# Patient Record
Sex: Female | Born: 1985 | Race: White | Hispanic: No | Marital: Single | Smoking: Current every day smoker
Health system: Southern US, Community
[De-identification: ages and names within clinical notes are randomized; demographics above are authoritative.]

## PROBLEM LIST (undated history)

## (undated) DIAGNOSIS — F99 Mental disorder, not otherwise specified: Secondary | ICD-10-CM

## (undated) DIAGNOSIS — K589 Irritable bowel syndrome without diarrhea: Secondary | ICD-10-CM

## (undated) DIAGNOSIS — R87619 Unspecified abnormal cytological findings in specimens from cervix uteri: Secondary | ICD-10-CM

## (undated) DIAGNOSIS — F17200 Nicotine dependence, unspecified, uncomplicated: Secondary | ICD-10-CM

## (undated) DIAGNOSIS — IMO0002 Reserved for concepts with insufficient information to code with codable children: Secondary | ICD-10-CM

## (undated) DIAGNOSIS — F988 Other specified behavioral and emotional disorders with onset usually occurring in childhood and adolescence: Secondary | ICD-10-CM

## (undated) DIAGNOSIS — F419 Anxiety disorder, unspecified: Secondary | ICD-10-CM

## (undated) DIAGNOSIS — J302 Other seasonal allergic rhinitis: Secondary | ICD-10-CM

## (undated) HISTORY — PX: COLPOSCOPY: SHX161

## (undated) HISTORY — PX: WISDOM TOOTH EXTRACTION: SHX21

## (undated) HISTORY — PX: CRYOTHERAPY: SHX1416

## (undated) HISTORY — PX: CERVICAL BIOPSY: SHX590

## (undated) HISTORY — PX: LEEP: SHX91

---
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
## 2011-04-30 ENCOUNTER — Ambulatory Visit: Payer: Self-pay

## 2011-05-08 ENCOUNTER — Encounter (HOSPITAL_COMMUNITY): Payer: Self-pay | Admitting: *Deleted

## 2011-05-08 ENCOUNTER — Inpatient Hospital Stay (HOSPITAL_COMMUNITY)
Admission: AD | Admit: 2011-05-08 | Discharge: 2011-05-08 | Disposition: A | Discharge status: Home / Self Care | Payer: BC Managed Care – PPO | Source: Ambulatory Visit | Attending: Obstetrics and Gynecology | Admitting: Obstetrics and Gynecology

## 2011-05-08 DIAGNOSIS — R109 Unspecified abdominal pain: Secondary | ICD-10-CM | POA: Insufficient documentation

## 2011-05-08 DIAGNOSIS — N719 Inflammatory disease of uterus, unspecified: Secondary | ICD-10-CM

## 2011-05-08 HISTORY — DX: Other specified behavioral and emotional disorders with onset usually occurring in childhood and adolescence: F98.8

## 2011-05-08 HISTORY — DX: Unspecified abnormal cytological findings in specimens from cervix uteri: R87.619

## 2011-05-08 HISTORY — DX: Anxiety disorder, unspecified: F41.9

## 2011-05-08 HISTORY — DX: Reserved for concepts with insufficient information to code with codable children: IMO0002

## 2011-05-08 HISTORY — DX: Irritable bowel syndrome, unspecified: K58.9

## 2011-05-08 LAB — URINALYSIS, ROUTINE W REFLEX MICROSCOPIC
Bilirubin Urine: NEGATIVE
Ketones, ur: NEGATIVE mg/dL
Nitrite: NEGATIVE
Protein, ur: NEGATIVE mg/dL
Specific Gravity, Urine: <1.005 — ABNORMAL LOW (ref 1.005–1.030)
Urobilinogen, UA: 0.2 mg/dL (ref 0.0–1.0)

## 2011-05-08 MED ORDER — IBUPROFEN 200 MG PO TABS: 600.0000 mg | ORAL_TABLET | Freq: Four times a day (QID) | ORAL | Status: DC | PRN

## 2011-05-08 MED ORDER — DOXYCYCLINE HYCLATE 100 MG PO CAPS: 100.0000 mg | ORAL_CAPSULE | Freq: Two times a day (BID) | ORAL | Status: AC

## 2011-05-08 MED ORDER — TRAMADOL HCL 50 MG PO TABS
50.0000 mg | ORAL_TABLET | Freq: Once | ORAL | Status: AC
  Administered 2011-05-08: 50 mg via ORAL
  Filled 2011-05-08: qty 1

## 2011-05-08 NOTE — ED Provider Notes (Signed)
History   25 yo had mirena IUD inserted on Monday.  Moderate cramping after it was placed, gradually improved.  Had intercourse last night.  Woke up this morning and having worse pain.  6-7 out of 10.  No radiation of pain.  Mild spotting.  No fevers, chills, nausea, vaginal discharge, dysuria.  Admits to frequency, but no feeling of incomplete emptying or urgency.  Chief Complaint  Patient presents with  . Abdominal Cramping   HPI   Past Medical History  Diagnosis Date  . Abnormal Pap smear     Past Surgical History  Procedure Date  . Cryotherapy   . Colposcopy   . Cervical biopsy   . Leep     No family history on file.  History  Substance Use Topics  . Smoking status: Current Some Day Smoker -- 1.0 packs/day  . Smokeless tobacco: Not on file  . Alcohol Use: 12.0 oz/week    20 Cans of beer per week    Allergies: No Known Allergies  No prescriptions prior to admission    Review of Systems  All other systems reviewed and are negative.   Physical Exam   Blood pressure 138/87, pulse 81, temperature 98.4 F (36.9 C), temperature source Oral, resp. rate 18, height 5\' 5"  (1.651 m), weight 61.145 kg (134 lb 12.8 oz), last menstrual period 04/30/2011.  Physical Exam  Constitutional: She appears well-developed and well-nourished.  HENT:  Head: Normocephalic and atraumatic.  Eyes: Pupils are equal, round, and reactive to light.  Neck: Normal range of motion. Neck supple.  Respiratory: Effort normal and breath sounds normal.  GI: Soft. She exhibits no distension and no mass. There is tenderness. There is no rebound and no guarding.  Genitourinary: No labial fusion. There is no rash, tenderness, lesion or injury on the right labia. There is no rash, tenderness, lesion or injury on the left labia. No erythema, tenderness or bleeding around the vagina. No foreign body around the vagina. No signs of injury around the vagina. No vaginal discharge found.       Healing tenaculum  sites that are not bleeding.  Strings barely visible from cervical os.   UA - negative for nitrites, LE MAU Course  Procedures   Assessment and Plan  1.  Endometritis Will cover with Doxycycline 100mg  bid x 7 days.  Ibuprofen 600mg  every 6 hours for cramping x 3 days.  Follow up with primary OB in 2 weeks for IUD check.  Willey Due JEHIEL 05/08/2011, 3:05 PM

## 2011-05-08 NOTE — Progress Notes (Signed)
Patient had Mirena IUD inserted on Monday, had cramping x 2 days, Wednesday, Thursday on off and today cramping is worse.

## 2011-05-11 ENCOUNTER — Ambulatory Visit (INDEPENDENT_AMBULATORY_CARE_PROVIDER_SITE_OTHER): Payer: Self-pay

## 2011-05-11 DIAGNOSIS — R197 Diarrhea, unspecified: Secondary | ICD-10-CM

## 2011-05-15 ENCOUNTER — Ambulatory Visit (INDEPENDENT_AMBULATORY_CARE_PROVIDER_SITE_OTHER): Payer: Self-pay

## 2011-05-15 DIAGNOSIS — R197 Diarrhea, unspecified: Secondary | ICD-10-CM

## 2011-06-02 ENCOUNTER — Ambulatory Visit (INDEPENDENT_AMBULATORY_CARE_PROVIDER_SITE_OTHER): Payer: BC Managed Care – PPO

## 2011-06-02 DIAGNOSIS — R197 Diarrhea, unspecified: Secondary | ICD-10-CM

## 2011-10-19 ENCOUNTER — Encounter (HOSPITAL_COMMUNITY): Payer: Self-pay

## 2011-10-22 ENCOUNTER — Encounter (HOSPITAL_COMMUNITY): Payer: Self-pay | Admitting: *Deleted

## 2011-10-28 NOTE — H&P (Signed)
27 year old female desires removal of IUD. She wishes to have sedation. Med History and Surgical History unremarkable NKDA Afebrile  Vital signs stable General alert and oriented Lung CTAB Car RRR Pelvic WNL Impression Desires removal of IUD  Plan Remove IUD

## 2011-10-29 ENCOUNTER — Encounter (HOSPITAL_COMMUNITY)
Admission: RE | Disposition: A | Discharge status: Home / Self Care | Payer: Self-pay | Source: Ambulatory Visit | Attending: Obstetrics and Gynecology

## 2011-10-29 ENCOUNTER — Encounter (HOSPITAL_COMMUNITY): Payer: Self-pay | Admitting: Anesthesiology

## 2011-10-29 ENCOUNTER — Encounter (HOSPITAL_COMMUNITY): Payer: Self-pay | Admitting: *Deleted

## 2011-10-29 ENCOUNTER — Ambulatory Visit (HOSPITAL_COMMUNITY): Payer: BC Managed Care – PPO | Admitting: Anesthesiology

## 2011-10-29 ENCOUNTER — Ambulatory Visit (HOSPITAL_COMMUNITY)
Admission: RE | Admit: 2011-10-29 | Discharge: 2011-10-29 | Disposition: A | Discharge status: Home / Self Care | Payer: BC Managed Care – PPO | Source: Ambulatory Visit | Attending: Obstetrics and Gynecology | Admitting: Obstetrics and Gynecology

## 2011-10-29 DIAGNOSIS — Z30432 Encounter for removal of intrauterine contraceptive device: Secondary | ICD-10-CM | POA: Insufficient documentation

## 2011-10-29 HISTORY — DX: Mental disorder, not otherwise specified: F99

## 2011-10-29 HISTORY — DX: Nicotine dependence, unspecified, uncomplicated: F17.200

## 2011-10-29 HISTORY — PX: IUD REMOVAL: SHX5392

## 2011-10-29 HISTORY — DX: Other seasonal allergic rhinitis: J30.2

## 2011-10-29 LAB — CBC
HCT: 42.5 % (ref 36.0–46.0)
MCH: 29.4 pg (ref 26.0–34.0)
MCHC: 34.4 g/dL (ref 30.0–36.0)
MCV: 85.7 fL (ref 78.0–100.0)
Platelets: 298 10*3/uL (ref 150–400)
RDW: 12.7 % (ref 11.5–15.5)
WBC: 6.4 10*3/uL (ref 4.0–10.5)

## 2011-10-29 SURGERY — REMOVAL, INTRAUTERINE DEVICE
Anesthesia: Monitor Anesthesia Care | Site: Cervix | Wound class: Clean Contaminated

## 2011-10-29 MED ORDER — ONDANSETRON HCL 4 MG/2ML IJ SOLN
INTRAMUSCULAR | Status: AC
  Filled 2011-10-29: qty 2

## 2011-10-29 MED ORDER — CEFAZOLIN SODIUM 1-5 GM-% IV SOLN
1.0000 g | INTRAVENOUS | Status: AC
  Administered 2011-10-29: 1 g via INTRAVENOUS

## 2011-10-29 MED ORDER — LIDOCAINE HCL (CARDIAC) 20 MG/ML IV SOLN
INTRAVENOUS | Status: DC | PRN
  Administered 2011-10-29: 30 mg via INTRAVENOUS

## 2011-10-29 MED ORDER — FENTANYL CITRATE 0.05 MG/ML IJ SOLN
INTRAMUSCULAR | Status: DC | PRN
  Administered 2011-10-29 (×2): 50 ug via INTRAVENOUS

## 2011-10-29 MED ORDER — MIDAZOLAM HCL 2 MG/2ML IJ SOLN
INTRAMUSCULAR | Status: AC
  Filled 2011-10-29: qty 2

## 2011-10-29 MED ORDER — CEFAZOLIN SODIUM 1-5 GM-% IV SOLN
INTRAVENOUS | Status: AC
  Filled 2011-10-29: qty 50

## 2011-10-29 MED ORDER — PROPOFOL 10 MG/ML IV EMUL
INTRAVENOUS | Status: AC
  Filled 2011-10-29: qty 20

## 2011-10-29 MED ORDER — DROSPIRENONE-ETHINYL ESTRADIOL 3-0.02 MG PO TABS: 1.0000 | ORAL_TABLET | Freq: Every day | ORAL | Status: AC

## 2011-10-29 MED ORDER — FENTANYL CITRATE 0.05 MG/ML IJ SOLN
INTRAMUSCULAR | Status: AC
  Filled 2011-10-29: qty 2

## 2011-10-29 MED ORDER — KETOROLAC TROMETHAMINE 30 MG/ML IJ SOLN
INTRAMUSCULAR | Status: AC
  Filled 2011-10-29: qty 1

## 2011-10-29 MED ORDER — LIDOCAINE HCL (CARDIAC) 20 MG/ML IV SOLN
INTRAVENOUS | Status: AC
  Filled 2011-10-29: qty 5

## 2011-10-29 MED ORDER — MIDAZOLAM HCL 5 MG/5ML IJ SOLN
INTRAMUSCULAR | Status: DC | PRN
  Administered 2011-10-29: 2 mg via INTRAVENOUS

## 2011-10-29 MED ORDER — PROPOFOL 10 MG/ML IV EMUL
INTRAVENOUS | Status: DC | PRN
  Administered 2011-10-29 (×2): 20 mg via INTRAVENOUS
  Administered 2011-10-29: 10 mg via INTRAVENOUS

## 2011-10-29 MED ORDER — LACTATED RINGERS IV SOLN
INTRAVENOUS | Status: DC
  Administered 2011-10-29: 125 mL/h via INTRAVENOUS

## 2011-10-29 SURGICAL SUPPLY — 10 items
CATH ROBINSON RED A/P 16FR (CATHETERS) ×2 IMPLANT
CLOTH BEACON ORANGE TIMEOUT ST (SAFETY) ×2 IMPLANT
GLOVE BIO SURGEON STRL SZ 6.5 (GLOVE) ×4 IMPLANT
GOWN PREVENTION PLUS LG XLONG (DISPOSABLE) ×4 IMPLANT
NEEDLE SPNL 22GX3.5 QUINCKE BK (NEEDLE) ×2 IMPLANT
PACK VAGINAL MINOR WOMEN LF (CUSTOM PROCEDURE TRAY) ×2 IMPLANT
PAD PREP 24X48 CUFFED NSTRL (MISCELLANEOUS) ×2 IMPLANT
SYR CONTROL 10ML LL (SYRINGE) ×2 IMPLANT
TOWEL OR 17X24 6PK STRL BLUE (TOWEL DISPOSABLE) ×4 IMPLANT
WATER STERILE IRR 1000ML POUR (IV SOLUTION) ×2 IMPLANT

## 2011-10-29 NOTE — Transfer of Care (Signed)
Immediate Anesthesia Transfer of Care Note  Patient: Jamie Rasmussen  Procedure(s) Performed: Procedure(s) (LRB): INTRAUTERINE DEVICE (IUD) REMOVAL (N/A)  Patient Location: PACU  Anesthesia Type: MAC  Level of Consciousness: awake  Airway & Oxygen Therapy: Patient Spontanous Breathing  Post-op Assessment: Post -op Vital signs reviewed and stable and Patient moving all extremities  Post vital signs: Reviewed and stable  Complications: No apparent anesthesia complications

## 2011-10-29 NOTE — Brief Op Note (Signed)
10/29/2011  7:33 AM  PATIENT:  Jamie Rasmussen  26 y.o. female  PRE-OPERATIVE DIAGNOSIS:  Patient desires IUD removal  POST-OPERATIVE DIAGNOSIS:  Patient desires IUD removal  PROCEDURE:  Procedure(s) (LRB): INTRAUTERINE DEVICE (IUD) REMOVAL (N/A)  SURGEON:  Surgeon(s) and Role:    * Jeani Hawking, MD - Primary  PHYSICIAN ASSISTANT:   ASSISTANTS: none   ANESTHESIA:   IV sedation  EBL:     BLOOD ADMINISTERED:none  DRAINS: none   LOCAL MEDICATIONS USED:  NONE  SPECIMEN:  No Specimen  DISPOSITION OF SPECIMEN:  N/A  COUNTS:  YES  TOURNIQUET:  * No tourniquets in log *  DICTATION: .Other Dictation: Dictation Number [id number]  PLAN OF CARE: Discharge to home after PACU  PATIENT DISPOSITION:  PACU - hemodynamically stable.   Delay start of Pharmacological VTE agent (>24hrs) due to surgical blood loss or risk of bleeding: yes

## 2011-10-29 NOTE — Anesthesia Postprocedure Evaluation (Signed)
  Anesthesia Post-op Note  Patient: Jamie Rasmussen  Procedure(s) Performed: Procedure(s) (LRB): INTRAUTERINE DEVICE (IUD) REMOVAL (N/A)  Patient Location: PACU  Anesthesia Type: MAC  Level of Consciousness: awake  Airway and Oxygen Therapy: Patient Spontanous Breathing  Post-op Pain: none  Post-op Assessment: Post-op Vital signs reviewed  Post-op Vital Signs: Reviewed and stable  Complications: No apparent anesthesia complications

## 2011-10-29 NOTE — Anesthesia Postprocedure Evaluation (Signed)
Anesthesia Post Note  Patient: Jamie Rasmussen  Procedure(s) Performed: Procedure(s) (LRB): INTRAUTERINE DEVICE (IUD) REMOVAL (N/A)  Anesthesia type: MAC  Patient location: PACU  Post pain: Pain level controlled  Post assessment: Post-op Vital signs reviewed  Last Vitals:  Filed Vitals:   10/29/11 0805  BP:   Pulse: 84  Temp:   Resp:     Post vital signs: Reviewed  Level of consciousness: sedated  Complications: No apparent anesthesia complications

## 2011-10-29 NOTE — Progress Notes (Signed)
History and physical on the chart. No significant changes Will proceed with IUD removal. Gianvi for birth control.

## 2011-10-29 NOTE — Anesthesia Preprocedure Evaluation (Signed)
Anesthesia Evaluation  Patient identified by MRN, date of birth, ID band Patient awake    Reviewed: Allergy & Precautions, H&P , NPO status , Patient's Chart, lab work & pertinent test results  Airway Mallampati: I TM Distance: >3 FB     Dental No notable dental hx. (+) Teeth Intact   Pulmonary Current Smoker,    Pulmonary exam normal       Cardiovascular negative cardio ROS  Rhythm:Regular Rate:Normal     Neuro/Psych Anxiety negative neurological ROS  negative psych ROS   GI/Hepatic Neg liver ROS, Irritable Bowel Syndrome   Endo/Other  negative endocrine ROS  Renal/GU negative Renal ROS  negative genitourinary   Musculoskeletal negative musculoskeletal ROS (+)   Abdominal Normal abdominal exam  (+)   Peds  Hematology negative hematology ROS (+)   Anesthesia Other Findings   Reproductive/Obstetrics negative OB ROS                           Anesthesia Physical Anesthesia Plan  ASA: II  Anesthesia Plan: MAC   Post-op Pain Management:    Induction: Intravenous  Airway Management Planned: Natural Airway and Mask  Additional Equipment:   Intra-op Plan:   Post-operative Plan:   Informed Consent: I have reviewed the patients History and Physical, chart, labs and discussed the procedure including the risks, benefits and alternatives for the proposed anesthesia with the patient or authorized representative who has indicated his/her understanding and acceptance.   Dental advisory given  Plan Discussed with: CRNA, Anesthesiologist and Surgeon  Anesthesia Plan Comments:         Anesthesia Quick Evaluation

## 2011-10-29 NOTE — Op Note (Signed)
NAMEMarland Kitchen  Jamie Rasmussen, Jamie Rasmussen                ACCOUNT NO.:  [id number]  MEDICAL RECORD NO.:  [id number]  LOCATION:  [facility]                          FACILITY:  [facility]  PHYSICIAN:  Malala Trenkamp L. Glen Blatchley, M.D.DATE OF BIRTH:  05/13/1986  DATE OF PROCEDURE: DATE OF DISCHARGE:                              OPERATIVE REPORT   PREOPERATIVE DIAGNOSIS:  Desires intrauterine device removal.  POSTOPERATIVE DIAGNOSIS:  Desires intrauterine device removal.  PROCEDURE:  Removal of intrauterine device.  SURGEON:  Fernando Stoiber L. Vincente Poli, MD  ANESTHESIA:  IV sedation.  ESTIMATED BLOOD LOSS:  Minimal to none.  COMPLICATIONS:  None.  PATHOLOGY:  None.  PROCEDURE:  The patient was taken to the operating room.  She was given IV sedation.  She was prepped and draped.  In-and-out catheter was used to empty the bladder.  A speculum was then inserted into the vagina. The cervix was seen and the IUD strings were easily seen.  Using a ring forceps, the IUD was then grasped and removed without any difficulty. All instruments were removed from the vagina.  All sponge, lap, and instrument counts were correct x2.  The patient went to recovery room in stable condition.     [name] L. Vincente Poli, M.D.     Florestine Avers  D:  10/29/2011  T:  10/29/2011  Job:  [id number]

## 2011-11-02 ENCOUNTER — Encounter (HOSPITAL_COMMUNITY): Payer: Self-pay | Admitting: Obstetrics and Gynecology

## 2012-03-22 ENCOUNTER — Encounter (HOSPITAL_COMMUNITY): Payer: Self-pay | Admitting: Emergency Medicine

## 2012-03-22 ENCOUNTER — Emergency Department (HOSPITAL_COMMUNITY)
Admission: EM | Admit: 2012-03-22 | Discharge: 2012-03-22 | Disposition: A | Discharge status: Home / Self Care | Payer: BC Managed Care – PPO | Attending: Emergency Medicine | Admitting: Emergency Medicine

## 2012-03-22 DIAGNOSIS — S61309A Unspecified open wound of unspecified finger with damage to nail, initial encounter: Secondary | ICD-10-CM

## 2012-03-22 DIAGNOSIS — F17200 Nicotine dependence, unspecified, uncomplicated: Secondary | ICD-10-CM | POA: Insufficient documentation

## 2012-03-22 DIAGNOSIS — S61209A Unspecified open wound of unspecified finger without damage to nail, initial encounter: Secondary | ICD-10-CM | POA: Insufficient documentation

## 2012-03-22 DIAGNOSIS — W230XXA Caught, crushed, jammed, or pinched between moving objects, initial encounter: Secondary | ICD-10-CM | POA: Insufficient documentation

## 2012-03-22 DIAGNOSIS — F909 Attention-deficit hyperactivity disorder, unspecified type: Secondary | ICD-10-CM | POA: Insufficient documentation
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
## 2012-03-22 NOTE — ED Provider Notes (Signed)
History     CSN: [id number]  Arrival date & time 03/22/12  2058   First MD Initiated Contact with Patient 03/22/12 2115      Chief Complaint  Patient presents with  . Finger Injury    (Consider location/radiation/quality/duration/timing/severity/associated sxs/prior treatment) HPI Comments: Patient had her finger caught between 2 hard objects now her right middle finger nail is cracked just within the Quick , amount of bleeding occurred.  The distal nail is painful with movement  The history is provided by the patient.    Past Medical History  Diagnosis Date  . Abnormal Pap smear   . IBS (irritable bowel syndrome)     diet controlled  . Anxiety   . Attention deficit disorder (ADD)   . Smoker   . Mental disorder     ADHD  . Seasonal allergies     Past Surgical History  Procedure Date  . Cryotherapy   . Colposcopy   . Cervical biopsy   . Leep   . Wisdom tooth extraction   . Iud removal 10/29/2011    Procedure: INTRAUTERINE DEVICE (IUD) REMOVAL;  Surgeon: Jeani Hawking, MD;  Location: [facility];  Service: Gynecology;  Laterality: N/A;    No family history on file.  History  Substance Use Topics  . Smoking status: Current Everyday Smoker -- 1.0 packs/day for 5 years    Types: Cigarettes  . Smokeless tobacco: Never Used  . Alcohol Use: 0.0 oz/week     24 cans or more per week per patient    OB History    Grav Para Term Preterm Abortions TAB SAB Ect Mult Living   0               Review of Systems  Constitutional: Negative for fever and chills.  Skin: Positive for wound.  Neurological: Negative for dizziness, weakness and numbness.    Allergies  Review of patient's allergies indicates no known allergies.  Home Medications   Current Outpatient Rx  Name Route Sig Dispense Refill  . ALBUTEROL SULFATE HFA 108 (90 BASE) MCG/ACT IN AERS Inhalation Inhale into the lungs every 6 (six) hours as needed. For shortness of breath    . DROSPIRENONE-ETHINYL  ESTRADIOL 3-0.02 MG PO TABS Oral Take 1 tablet by mouth daily. 1 Package 11    BP 141/97  Pulse 109  Temp 99.1 F (37.3 C) (Oral)  Resp 20  SpO2 100%  LMP 03/18/2012  Physical Exam  Constitutional: She appears well-developed and well-nourished.  Eyes: Pupils are equal, round, and reactive to light.  Neck: Normal range of motion.  Cardiovascular: Normal rate.   Musculoskeletal: Normal range of motion.  Skin:       The right, middle finger nail is cracked approximately 2 mm within the nail.  Quick small amount of bleeding noted.  Nail is still attached on the medial corner    ED Course  NAIL REMOVAL Date/Time: 03/22/2012 10:18 PM Performed by: Arman Filter Authorized by: Arman Filter Consent: Verbal consent obtained. Consent given by: patient Patient understanding: patient states understanding of the procedure being performed Patient identity confirmed: verbally with patient Time out: Immediately prior to procedure a "time out" was called to verify the correct patient, procedure, equipment, support staff and site/side marked as required. Location: right hand Anesthesia: digital block Local anesthetic: bupivacaine 0.25% without epinephrine Anesthetic total: 3 ml Patient sedated: no Amount removed: 1/4 Nail bed sutured: no Nail matrix removed: none Removed nail replaced and anchored:  no Dressing: antibiotic ointment Comments: Superficial laceration to the nail plate without bleeding after the nail was removed   (including critical care time)  Labs Reviewed - No data to display No results found.   1. Nail avulsion, finger       MDM   Nail avulsion,Partial distal portion removed without incident         Arman Filter, NP 03/22/12 2222  Arman Filter, NP 03/22/12 2222

## 2012-03-23 NOTE — ED Provider Notes (Signed)
Medical screening examination/treatment/procedure(s) were performed by non-physician practitioner and as supervising physician I was immediately available for consultation/collaboration.  Satrina Magallanes, MD 03/23/12 0229 

## 2012-08-15 ENCOUNTER — Emergency Department (HOSPITAL_COMMUNITY)
Admission: EM | Admit: 2012-08-15 | Discharge: 2012-08-16 | Disposition: A | Discharge status: Home / Self Care | Payer: BC Managed Care – PPO | Attending: Emergency Medicine | Admitting: Emergency Medicine

## 2012-08-15 ENCOUNTER — Emergency Department (HOSPITAL_COMMUNITY): Payer: BC Managed Care – PPO

## 2012-08-15 ENCOUNTER — Encounter (HOSPITAL_COMMUNITY): Payer: Self-pay | Admitting: *Deleted

## 2012-08-15 DIAGNOSIS — Y92410 Unspecified street and highway as the place of occurrence of the external cause: Secondary | ICD-10-CM | POA: Insufficient documentation

## 2012-08-15 DIAGNOSIS — F909 Attention-deficit hyperactivity disorder, unspecified type: Secondary | ICD-10-CM | POA: Insufficient documentation

## 2012-08-15 DIAGNOSIS — F411 Generalized anxiety disorder: Secondary | ICD-10-CM | POA: Insufficient documentation

## 2012-08-15 DIAGNOSIS — S20219A Contusion of unspecified front wall of thorax, initial encounter: Secondary | ICD-10-CM

## 2012-08-15 DIAGNOSIS — S46909A Unspecified injury of unspecified muscle, fascia and tendon at shoulder and upper arm level, unspecified arm, initial encounter: Secondary | ICD-10-CM | POA: Insufficient documentation

## 2012-08-15 DIAGNOSIS — S4980XA Other specified injuries of shoulder and upper arm, unspecified arm, initial encounter: Secondary | ICD-10-CM | POA: Insufficient documentation

## 2012-08-15 DIAGNOSIS — S8990XA Unspecified injury of unspecified lower leg, initial encounter: Secondary | ICD-10-CM | POA: Insufficient documentation

## 2012-08-15 DIAGNOSIS — Z79899 Other long term (current) drug therapy: Secondary | ICD-10-CM | POA: Insufficient documentation

## 2012-08-15 DIAGNOSIS — Z8719 Personal history of other diseases of the digestive system: Secondary | ICD-10-CM | POA: Insufficient documentation

## 2012-08-15 DIAGNOSIS — Y9389 Activity, other specified: Secondary | ICD-10-CM | POA: Insufficient documentation

## 2012-08-15 DIAGNOSIS — F17200 Nicotine dependence, unspecified, uncomplicated: Secondary | ICD-10-CM | POA: Insufficient documentation

## 2012-08-15 MED ORDER — IBUPROFEN 400 MG PO TABS
400.0000 mg | ORAL_TABLET | Freq: Once | ORAL | Status: AC
  Administered 2012-08-16: 400 mg via ORAL
  Filled 2012-08-15: qty 1
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
## 2012-08-15 NOTE — ED Notes (Signed)
Patient returned from X-ray 
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
## 2017-05-30 ENCOUNTER — Ambulatory Visit (HOSPITAL_COMMUNITY)
Admission: EM | Admit: 2017-05-30 | Discharge: 2017-05-30 | Disposition: A | Discharge status: Home / Self Care | Payer: PRIVATE HEALTH INSURANCE | Attending: Internal Medicine | Admitting: Internal Medicine

## 2017-05-30 ENCOUNTER — Encounter (HOSPITAL_COMMUNITY): Payer: Self-pay | Admitting: *Deleted

## 2017-05-30 DIAGNOSIS — T192XXA Foreign body in vulva and vagina, initial encounter: Secondary | ICD-10-CM | POA: Diagnosis not present

## 2017-05-30 NOTE — ED Triage Notes (Signed)
Patient states she had sexual intercourse and forgot she had tampon in this am. Unable to retreive.

## 2017-05-30 NOTE — ED Provider Notes (Signed)
[facility]    CSN: [id number] Arrival date & time: 05/30/17  1248     History   Chief Complaint Chief Complaint  Patient presents with  . Foreign Body in Vagina    HPI Jamie Rasmussen is a 31 y.o. female.   31 year old female comes in with 1 day history of foreign body in the vagina. States that she had sexual intercourse this morning and forgot that she had a tampon in. She was unable to retreive on own. Denies fever, chills, night sweats. Denies abdominal pain, nausea, vomiting.       Past Medical History:  Diagnosis Date  . Abnormal Pap smear   . Anxiety   . Attention deficit disorder (ADD)   . IBS (irritable bowel syndrome)    diet controlled  . Mental disorder    ADHD  . Seasonal allergies   . Smoker     There are no active problems to display for this patient.   Past Surgical History:  Procedure Laterality Date  . CERVICAL BIOPSY    . COLPOSCOPY    . CRYOTHERAPY    . IUD REMOVAL  10/29/2011   Procedure: INTRAUTERINE DEVICE (IUD) REMOVAL;  Surgeon: Jeani Hawking, MD;  Location: [facility];  Service: Gynecology;  Laterality: N/A;  . LEEP    . WISDOM TOOTH EXTRACTION      OB History    Gravida Para Term Preterm AB Living   0             SAB TAB Ectopic Multiple Live Births                   Home Medications    Prior to Admission medications   Medication Sig Start Date End Date Taking? Authorizing Provider  ALPRAZolam (XANAX) 0.25 MG tablet Take 0.25 mg by mouth 3 (three) times daily as needed. For anxiety   Yes [provider]  amphetamine-dextroamphetamine (ADDERALL) 20 MG tablet Take 20 mg by mouth daily.   Yes [provider]    Family History History reviewed. No pertinent family history.  Social History Social History  Substance Use Topics  . Smoking status: Current Every Day Smoker    Packs/day: 1.00    Years: 5.00    Types: Cigarettes  . Smokeless tobacco: Never Used  . Alcohol use 0.0 oz/week   Comment: 24 cans or more per week per patient     Allergies   Patient has no known allergies.   Review of Systems Review of Systems  Reason unable to perform ROS: See HPI as above.     Physical Exam Triage Vital Signs ED Triage Vitals [05/30/17 1400]  Enc Vitals Group     BP 131/81     Pulse Rate 100     Resp 17     Temp 98 F (36.7 C)     Temp Source Oral     SpO2 100 %     Weight      Height      Head Circumference      Peak Flow      Pain Score      Pain Loc      Pain Edu?      Excl. in GC?    No data found.   Updated Vital Signs BP 131/81 (BP Location: Left Arm)   Pulse 100   Temp 98 F (36.7 C) (Oral)   Resp 17   SpO2 100%  Physical Exam  Constitutional: She is oriented to person, place, and time. She appears well-developed and well-nourished. No distress.  HENT:  Head: Normocephalic and atraumatic.  Eyes: Pupils are equal, round, and reactive to light. Conjunctivae are normal.  Genitourinary:  Genitourinary Comments: Tampon visualized posterior to the cervix. Mild blood in vagina/cervix. Tampon removed without difficulty.   Neurological: She is alert and oriented to person, place, and time.     UC Treatments / Results  Labs (all labs ordered are listed, but only abnormal results are displayed) Labs Reviewed - No data to display  EKG  EKG Interpretation None       Radiology No results found.  Procedures Procedures (including critical care time)  Medications Ordered in UC Medications - No data to display   Initial Impression / Assessment and Plan / UC Course  I have reviewed the triage vital signs and the nursing notes.  Pertinent labs & imaging results that were available during my care of the patient were reviewed by me and considered in my medical decision making (see chart for details).    Foreign body removed without difficulty, patient tolerated procedure well. Patient left prior to discharge paperwork.   Final Clinical  Impressions(s) / UC Diagnoses   Final diagnoses:  Foreign body in vagina, initial encounter    New Prescriptions New Prescriptions   No medications on file      Belinda Fisher, Cordelia Poche 05/30/17 1449

## 2017-05-30 NOTE — Discharge Instructions (Signed)
Foreign body removed. No obvious other foreign body visualized. Monitor for any changes/worsening of symptoms, follow up here or with GYN/PCP for further evlauation.
